# Patient Record
Sex: Male | Born: 2000 | Hispanic: Yes | Marital: Single | Smoking: Never smoker
Health system: Southern US, Community
[De-identification: ages and names within clinical notes are randomized; demographics above are authoritative.]

---
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
## 2020-12-30 ENCOUNTER — Ambulatory Visit (HOSPITAL_COMMUNITY)
Admission: EM | Admit: 2020-12-30 | Discharge: 2020-12-30 | Disposition: A | Discharge status: Home / Self Care | Payer: Medicaid Other | Attending: Internal Medicine | Admitting: Internal Medicine

## 2020-12-30 ENCOUNTER — Encounter (HOSPITAL_COMMUNITY): Payer: Self-pay

## 2020-12-30 ENCOUNTER — Other Ambulatory Visit: Payer: Self-pay

## 2020-12-30 DIAGNOSIS — G510 Bell's palsy: Secondary | ICD-10-CM | POA: Diagnosis not present

## 2020-12-30 MED ORDER — PREDNISONE 20 MG PO TABS: 60.0000 mg | ORAL_TABLET | Freq: Every day | ORAL | 0 refills | Status: AC

## 2020-12-30 MED ORDER — VALACYCLOVIR HCL 1 G PO TABS: 1000.0000 mg | ORAL_TABLET | Freq: Three times a day (TID) | ORAL | 0 refills | Status: AC

## 2020-12-30 NOTE — ED Triage Notes (Signed)
Pt presents with left side facial numbness, headache, and slight dizziness X 3 days.

## 2020-12-30 NOTE — Discharge Instructions (Signed)
Please wear an eye patch at bedtime Use artificial tears to keep eyes lubricated It may take up to 4 to 6 weeks for the weakness on the left side and numbness to resolve Return to urgent care if you have any other concerns.

## 2020-12-30 NOTE — ED Provider Notes (Signed)
[facility]    CSN: [id number] Arrival date & time: 12/30/20  1100      History   Chief Complaint Chief Complaint  Patient presents with  . Facial Numbness  . Dizziness  . Headache    HPI Trevor Franco is a 20 y.o. male comes to the urgent care with left-sided facial weakness of 3 days duration.  Patient experienced numbness and headache a few days ago.  Following that patient noticed weakness on the left side of the face and difficulty closing the left eye.  He had some slight facial numbness.  No double vision or blurred vision.  No change in his voice or difficulty finding his words.  No fever or chills.   HPI  History reviewed. No pertinent past medical history.  There are no problems to display for this patient.   History reviewed. No pertinent surgical history.     Home Medications    Prior to Admission medications   Medication Sig Start Date End Date Taking? Authorizing Provider  predniSONE (DELTASONE) 20 MG tablet Take 3 tablets (60 mg total) by mouth daily for 7 days. 12/30/20 01/06/21 Yes Corianna Avallone, Britta Mccreedy, MD  valACYclovir (VALTREX) 1000 MG tablet Take 1 tablet (1,000 mg total) by mouth 3 (three) times daily for 7 days. 12/30/20 01/06/21 Yes Jakayla Schweppe, Britta Mccreedy, MD  clotrimazole (LOTRIMIN) 1 % cream Apply to affected area 2 times daily 12/12/13   Francee Piccolo, PA-C    Family History Family History  Family history unknown: Yes    Social History Social History   Tobacco Use  . Smoking status: Never Smoker  Substance Use Topics  . Alcohol use: No     Allergies   Patient has no known allergies.   Review of Systems Review of Systems  Musculoskeletal: Negative.   Neurological: Negative for dizziness, light-headedness and headaches.  Psychiatric/Behavioral: Negative.      Physical Exam Triage Vital Signs ED Triage Vitals  Enc Vitals Group     BP 12/30/20 1151 (!) 147/78     Pulse Rate 12/30/20 1151 66     Resp  12/30/20 1151 17     Temp 12/30/20 1151 99.4 F (37.4 C)     Temp Source 12/30/20 1151 Oral     SpO2 12/30/20 1151 99 %     Weight --      Height --      Head Circumference --      Peak Flow --      Pain Score 12/30/20 1153 4     Pain Loc --      Pain Edu? --      Excl. in GC? --    No data found.  Updated Vital Signs BP (!) 147/78 (BP Location: Left Arm)   Pulse 66   Temp 99.4 F (37.4 C) (Oral)   Resp 17   SpO2 99%   Visual Acuity Right Eye Distance:   Left Eye Distance:   Bilateral Distance:    Right Eye Near:   Left Eye Near:    Bilateral Near:     Physical Exam Vitals and nursing note reviewed.  Eyes:     General: No visual field deficit. Cardiovascular:     Rate and Rhythm: Normal rate and regular rhythm.     Heart sounds: Normal heart sounds.  Pulmonary:     Effort: Pulmonary effort is normal.     Breath sounds: Normal breath sounds.  Abdominal:     General: Bowel  sounds are normal.     Palpations: Abdomen is soft.  Neurological:     Mental Status: He is alert and oriented to person, place, and time.     Cranial Nerves: Cranial nerve deficit and facial asymmetry present. No dysarthria.     Sensory: No sensory deficit.     Motor: No weakness.     Comments: Left-sided facial weakness.  Patient is unable to close left eye totally      UC Treatments / Results  Labs (all labs ordered are listed, but only abnormal results are displayed) Labs Reviewed - No data to display  EKG   Radiology No results found.  Procedures Procedures (including critical care time)  Medications Ordered in UC Medications - No data to display  Initial Impression / Assessment and Plan / UC Course  I have reviewed the triage vital signs and the nursing notes.  Pertinent labs & imaging results that were available during my care of the patient were reviewed by me and considered in my medical decision making (see chart for details).     1.  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 Return to urgent care if symptoms worsen Final Clinical Impressions(s) / UC Diagnoses   Final diagnoses:  Left-sided Bell's palsy     Discharge Instructions     Please wear an eye patch at bedtime Use artificial tears to keep eyes lubricated It may take up to 4 to 6 weeks for the weakness on the left side and numbness to resolve Return to urgent care if you have any other concerns.   ED Prescriptions    Medication Sig Dispense Auth. Provider   predniSONE (DELTASONE) 20 MG tablet Take 3 tablets (60 mg total) by mouth daily for 7 days. 21 tablet Linnet Bottari, Britta Mccreedy, MD   valACYclovir (VALTREX) 1000 MG tablet Take 1 tablet (1,000 mg total) by mouth 3 (three) times daily for 7 days. 21 tablet Delno Blaisdell, Britta Mccreedy, MD     PDMP not reviewed this encounter.   Merrilee Jansky, MD 12/30/20 [phone]
